# Patient Record
Sex: Female | Born: 1985 | Race: White | Hispanic: No | Marital: Married | Smoking: Never smoker
Health system: Southern US, Community
[De-identification: ages and names within clinical notes are randomized; demographics above are authoritative.]

## PROBLEM LIST (undated history)

## (undated) ENCOUNTER — Inpatient Hospital Stay (HOSPITAL_COMMUNITY): Payer: Self-pay

## (undated) DIAGNOSIS — J069 Acute upper respiratory infection, unspecified: Secondary | ICD-10-CM

## (undated) DIAGNOSIS — J45909 Unspecified asthma, uncomplicated: Secondary | ICD-10-CM

## (undated) HISTORY — PX: NO PAST SURGERIES: SHX2092

## (undated) HISTORY — PX: OTHER SURGICAL HISTORY: SHX169

---

## 2014-09-10 LAB — OB RESULTS CONSOLE RPR: RPR: NONREACTIVE

## 2014-09-10 LAB — OB RESULTS CONSOLE ABO/RH: RH Type: POSITIVE

## 2014-09-10 LAB — OB RESULTS CONSOLE RUBELLA ANTIBODY, IGM: Rubella: IMMUNE

## 2014-09-10 LAB — OB RESULTS CONSOLE HIV ANTIBODY (ROUTINE TESTING): HIV: NONREACTIVE

## 2014-09-10 LAB — OB RESULTS CONSOLE ANTIBODY SCREEN: Antibody Screen: NEGATIVE

## 2014-09-10 LAB — OB RESULTS CONSOLE HEPATITIS B SURFACE ANTIGEN: Hepatitis B Surface Ag: NEGATIVE

## 2014-09-10 LAB — OB RESULTS CONSOLE GC/CHLAMYDIA
CHLAMYDIA, DNA PROBE: NEGATIVE
Gonorrhea: NEGATIVE

## 2014-10-09 NOTE — L&D Delivery Note (Signed)
Delivery Note Pt pushed great for about 40 minutes and at 10:11 PM a healthy female was delivered via Vaginal, Spontaneous Delivery (Presentation: OA  ).  APGAR: 9, 9; weight 8 lb 3.9 oz (3740 g).   Placenta status: Intact, Spontaneous.  Cord: 3 vessels with the following complications: True Knot.    Anesthesia: Epidural  Episiotomy: None Lacerations: Partial 3rd degree;sulcal Suture Repair: 2.0 3.0 vicryl  In overlapping fashion on the rectal sphincter  Est. Blood Loss (mL): 660ml  Pt had mild atony following delivery which resolved with pitocin and bimanual massage.  She did have more than typical bleeding from her laceration while completing repair.  After repair, we sat her up to breastfeed and she felt  Faint.  BP dropped to 80's systolic, but recovered quickly with reclining pt and a fluid bolus.  Her bleeding was WNL and fundus firm.  Pulse was stable at 98 and BP recovered to 90's/60's which was not far from her baseline. She was able to sit after drinking some juice.  Mom to postpartum.  Baby to Couplet care / Skin to Skin.  Oliver PilaRICHARDSON,Nilton Lave W 03/06/2015, 11:29 PM

## 2015-01-31 LAB — OB RESULTS CONSOLE GBS: GBS: POSITIVE

## 2015-03-01 ENCOUNTER — Inpatient Hospital Stay (HOSPITAL_COMMUNITY)
Admission: AD | Admit: 2015-03-01 | Discharge: 2015-03-01 | Disposition: A | Discharge status: Home / Self Care | Payer: Managed Care, Other (non HMO) | Source: Ambulatory Visit | Attending: Obstetrics and Gynecology | Admitting: Obstetrics and Gynecology

## 2015-03-01 ENCOUNTER — Encounter (HOSPITAL_COMMUNITY): Payer: Self-pay

## 2015-03-01 DIAGNOSIS — O26893 Other specified pregnancy related conditions, third trimester: Secondary | ICD-10-CM | POA: Diagnosis not present

## 2015-03-01 DIAGNOSIS — Z3A39 39 weeks gestation of pregnancy: Secondary | ICD-10-CM | POA: Insufficient documentation

## 2015-03-01 HISTORY — DX: Acute upper respiratory infection, unspecified: J06.9

## 2015-03-01 LAB — POCT FERN TEST: POCT FERN TEST: NEGATIVE

## 2015-03-01 NOTE — MAU Provider Note (Signed)
S: Shawna Lopez is a 29 y.o. G1P0 at 7686w6d who presents today with leaking of fluid. She denies any VB. She confirms fetal movement. She has had some brown discharge. She was checked in the office on Wednesday, and was told she was closed. She denies any urinary sx. She did not have a large gush of fluid. She has not needed to wear a pad.  O: VSS, afebrile Abdomen: soft, non-tender, gravid External: no lesion Vagina: small amount of brown mucous. No pooling Cervix: pink, smooth, no fluid seen with valsalva. Closed/50/-2 Uterus: AGA FHT: 135, moderate with 15x15 accels, no decels Toco: no UCs  Results for orders placed or performed during the hospital encounter of 03/01/15 (from the past 24 hour(s))  Fern Test     Status: None   Collection Time: 03/01/15  8:38 PM  Result Value Ref Range   POCT Fern Test Negative = intact amniotic membranes    A/P: Exam for ROM RN will report to attending MD

## 2015-03-01 NOTE — Discharge Instructions (Signed)
Braxton Hicks Contractions °Contractions of the uterus can occur throughout pregnancy. Contractions are not always a sign that you are in labor.  °WHAT ARE BRAXTON HICKS CONTRACTIONS?  °Contractions that occur before labor are called Braxton Hicks contractions, or false labor. Toward the end of pregnancy (32-34 weeks), these contractions can develop more often and may become more forceful. This is not true labor because these contractions do not result in opening (dilatation) and thinning of the cervix. They are sometimes difficult to tell apart from true labor because these contractions can be forceful and people have different pain tolerances. You should not feel embarrassed if you go to the hospital with false labor. Sometimes, the only way to tell if you are in true labor is for your health care provider to look for changes in the cervix. °If there are no prenatal problems or other health problems associated with the pregnancy, it is completely safe to be sent home with false labor and await the onset of true labor. °HOW CAN YOU TELL THE DIFFERENCE BETWEEN TRUE AND FALSE LABOR? °False Labor °· The contractions of false labor are usually shorter and not as hard as those of true labor.   °· The contractions are usually irregular.   °· The contractions are often felt in the front of the lower abdomen and in the groin.   °· The contractions may go away when you walk around or change positions while lying down.   °· The contractions get weaker and are shorter lasting as time goes on.   °· The contractions do not usually become progressively stronger, regular, and closer together as with true labor.   °True Labor °· Contractions in true labor last 30-70 seconds, become very regular, usually become more intense, and increase in frequency.   °· The contractions do not go away with walking.   °· The discomfort is usually felt in the top of the uterus and spreads to the lower abdomen and low back.   °· True labor can be  determined by your health care provider with an exam. This will show that the cervix is dilating and getting thinner.   °WHAT TO REMEMBER °· Keep up with your usual exercises and follow other instructions given by your health care provider.   °· Take medicines as directed by your health care provider.   °· Keep your regular prenatal appointments.   °· Eat and drink lightly if you think you are going into labor.   °· If Braxton Hicks contractions are making you uncomfortable:   °¨ Change your position from lying down or resting to walking, or from walking to resting.   °¨ Sit and rest in a tub of warm water.   °¨ Drink 2-3 glasses of water. Dehydration may cause these contractions.   °¨ Do slow and deep breathing several times an hour.   °WHEN SHOULD I SEEK IMMEDIATE MEDICAL CARE? °Seek immediate medical care if: °· Your contractions become stronger, more regular, and closer together.   °· You have fluid leaking or gushing from your vagina.   °· You have a fever.   °· You pass blood-tinged mucus.   °· You have vaginal bleeding.   °· You have continuous abdominal pain.   °· You have low back pain that you never had before.   °· You feel your baby's head pushing down and causing pelvic pressure.   °· Your baby is not moving as much as it used to.   °Document Released: 09/25/2005 Document Revised: 09/30/2013 Document Reviewed: 07/07/2013 °[employer]® Patient Information ©2015 [employer]. This information is not intended to replace advice given to you by your health care   provider. Make sure you discuss any questions you have with your health care provider. ° °

## 2015-03-01 NOTE — MAU Note (Signed)
Leaking clear fluid this morning; since then it has turned into brown discharge. Denies contractions. Positive fetal movement.

## 2015-03-04 ENCOUNTER — Encounter (HOSPITAL_COMMUNITY): Payer: Self-pay | Admitting: *Deleted

## 2015-03-04 ENCOUNTER — Telehealth (HOSPITAL_COMMUNITY): Payer: Self-pay | Admitting: *Deleted

## 2015-03-04 NOTE — Telephone Encounter (Signed)
Preadmission screen  

## 2015-03-06 ENCOUNTER — Inpatient Hospital Stay (HOSPITAL_COMMUNITY): Payer: Managed Care, Other (non HMO) | Admitting: Anesthesiology

## 2015-03-06 ENCOUNTER — Encounter (HOSPITAL_COMMUNITY): Payer: Self-pay | Admitting: *Deleted

## 2015-03-06 ENCOUNTER — Inpatient Hospital Stay (HOSPITAL_COMMUNITY)
Admission: AD | Admit: 2015-03-06 | Discharge: 2015-03-08 | DRG: 988 | Disposition: A | Discharge status: Home / Self Care | Payer: Managed Care, Other (non HMO) | Source: Ambulatory Visit | Attending: Obstetrics and Gynecology | Admitting: Obstetrics and Gynecology

## 2015-03-06 DIAGNOSIS — Z3403 Encounter for supervision of normal first pregnancy, third trimester: Secondary | ICD-10-CM | POA: Diagnosis present

## 2015-03-06 DIAGNOSIS — O480 Post-term pregnancy: Secondary | ICD-10-CM | POA: Diagnosis present

## 2015-03-06 DIAGNOSIS — IMO0001 Reserved for inherently not codable concepts without codable children: Secondary | ICD-10-CM

## 2015-03-06 DIAGNOSIS — Z3A40 40 weeks gestation of pregnancy: Secondary | ICD-10-CM | POA: Diagnosis present

## 2015-03-06 DIAGNOSIS — O99824 Streptococcus B carrier state complicating childbirth: Secondary | ICD-10-CM | POA: Diagnosis present

## 2015-03-06 LAB — CBC
HCT: 38.3 % (ref 36.0–46.0)
Hemoglobin: 12.9 g/dL (ref 12.0–15.0)
MCH: 31.9 pg (ref 26.0–34.0)
MCHC: 33.7 g/dL (ref 30.0–36.0)
MCV: 94.6 fL (ref 78.0–100.0)
Platelets: 188 K/uL (ref 150–400)
RBC: 4.05 MIL/uL (ref 3.87–5.11)
RDW: 14.7 % (ref 11.5–15.5)
WBC: 11.6 K/uL — ABNORMAL HIGH (ref 4.0–10.5)

## 2015-03-06 LAB — TYPE AND SCREEN
ABO/RH(D): A POS
Antibody Screen: NEGATIVE

## 2015-03-06 LAB — ABO/RH: ABO/RH(D): A POS

## 2015-03-06 MED ORDER — FENTANYL 2.5 MCG/ML BUPIVACAINE 1/10 % EPIDURAL INFUSION (WH - ANES)
14.0000 mL/h | INTRAMUSCULAR | Status: DC | PRN
  Administered 2015-03-06 (×2): 14 mL/h via EPIDURAL
  Filled 2015-03-06: qty 125

## 2015-03-06 MED ORDER — LIDOCAINE HCL (PF) 1 % IJ SOLN
INTRAMUSCULAR | Status: DC | PRN
  Administered 2015-03-06 (×2): 4 mL

## 2015-03-06 MED ORDER — ONDANSETRON HCL 4 MG/2ML IJ SOLN: 4.0000 mg | Freq: Four times a day (QID) | INTRAMUSCULAR | Status: DC | PRN

## 2015-03-06 MED ORDER — LACTATED RINGERS IV SOLN
INTRAVENOUS | Status: DC
  Administered 2015-03-06 (×2): via INTRAVENOUS

## 2015-03-06 MED ORDER — FLEET ENEMA 7-19 GM/118ML RE ENEM: 1.0000 | ENEMA | Freq: Every day | RECTAL | Status: DC | PRN

## 2015-03-06 MED ORDER — ACETAMINOPHEN 325 MG PO TABS: 650.0000 mg | ORAL_TABLET | ORAL | Status: DC | PRN

## 2015-03-06 MED ORDER — LACTATED RINGERS IV SOLN
500.0000 mL | INTRAVENOUS | Status: DC | PRN
  Administered 2015-03-06: 250 mL via INTRAVENOUS

## 2015-03-06 MED ORDER — DIPHENHYDRAMINE HCL 50 MG/ML IJ SOLN: 12.5000 mg | INTRAMUSCULAR | Status: DC | PRN

## 2015-03-06 MED ORDER — OXYCODONE-ACETAMINOPHEN 5-325 MG PO TABS: 2.0000 | ORAL_TABLET | ORAL | Status: DC | PRN

## 2015-03-06 MED ORDER — CLINDAMYCIN PHOSPHATE 900 MG/50ML IV SOLN
900.0000 mg | Freq: Three times a day (TID) | INTRAVENOUS | Status: DC
  Administered 2015-03-06: 900 mg via INTRAVENOUS
  Filled 2015-03-06 (×4): qty 50

## 2015-03-06 MED ORDER — OXYTOCIN 40 UNITS IN LACTATED RINGERS INFUSION - SIMPLE MED
62.5000 mL/h | INTRAVENOUS | Status: DC
  Administered 2015-03-06: 62.5 mL/h via INTRAVENOUS

## 2015-03-06 MED ORDER — LIDOCAINE HCL (PF) 1 % IJ SOLN
30.0000 mL | INTRAMUSCULAR | Status: DC | PRN
  Filled 2015-03-06: qty 30

## 2015-03-06 MED ORDER — OXYCODONE-ACETAMINOPHEN 5-325 MG PO TABS: 1.0000 | ORAL_TABLET | ORAL | Status: DC | PRN

## 2015-03-06 MED ORDER — PHENYLEPHRINE 40 MCG/ML (10ML) SYRINGE FOR IV PUSH (FOR BLOOD PRESSURE SUPPORT)
80.0000 ug | PREFILLED_SYRINGE | INTRAVENOUS | Status: DC | PRN
  Filled 2015-03-06: qty 20
  Filled 2015-03-06: qty 2

## 2015-03-06 MED ORDER — EPHEDRINE 5 MG/ML INJ
10.0000 mg | INTRAVENOUS | Status: DC | PRN
  Filled 2015-03-06: qty 2

## 2015-03-06 MED ORDER — OXYTOCIN 40 UNITS IN LACTATED RINGERS INFUSION - SIMPLE MED
1.0000 m[IU]/min | INTRAVENOUS | Status: DC
  Administered 2015-03-06: 2 m[IU]/min via INTRAVENOUS
  Filled 2015-03-06: qty 1000

## 2015-03-06 MED ORDER — TERBUTALINE SULFATE 1 MG/ML IJ SOLN: 0.2500 mg | Freq: Once | INTRAMUSCULAR | Status: AC | PRN

## 2015-03-06 MED ORDER — BUTORPHANOL TARTRATE 1 MG/ML IJ SOLN: 1.0000 mg | INTRAMUSCULAR | Status: DC | PRN

## 2015-03-06 MED ORDER — CITRIC ACID-SODIUM CITRATE 334-500 MG/5ML PO SOLN: 30.0000 mL | ORAL | Status: DC | PRN

## 2015-03-06 MED ORDER — OXYTOCIN BOLUS FROM INFUSION
500.0000 mL | INTRAVENOUS | Status: DC
  Administered 2015-03-06 (×2): 500 mL via INTRAVENOUS

## 2015-03-06 NOTE — Anesthesia Preprocedure Evaluation (Signed)
Anesthesia Evaluation  Patient identified by MRN, date of birth, ID band Patient awake    Reviewed: Allergy & Precautions, NPO status , Patient's Chart, lab work & pertinent test results  History of Anesthesia Complications Negative for: history of anesthetic complications  Airway Mallampati: II  TM Distance: >3 FB Neck ROM: Full    Dental no notable dental hx. (+) Dental Advisory Given   Pulmonary neg pulmonary ROS,  breath sounds clear to auscultation  Pulmonary exam normal       Cardiovascular negative cardio ROS Normal cardiovascular examRhythm:Regular Rate:Normal     Neuro/Psych negative neurological ROS  negative psych ROS   GI/Hepatic negative GI ROS, Neg liver ROS,   Endo/Other  negative endocrine ROS  Renal/GU negative Renal ROS  negative genitourinary   Musculoskeletal negative musculoskeletal ROS (+)   Abdominal   Peds negative pediatric ROS (+)  Hematology negative hematology ROS (+)   Anesthesia Other Findings   Reproductive/Obstetrics (+) Pregnancy                             Anesthesia Physical Anesthesia Plan  ASA: II  Anesthesia Plan: Epidural   Post-op Pain Management:    Induction:   Airway Management Planned:   Additional Equipment:   Intra-op Plan:   Post-operative Plan:   Informed Consent: I have reviewed the patients History and Physical, chart, labs and discussed the procedure including the risks, benefits and alternatives for the proposed anesthesia with the patient or authorized representative who has indicated his/her understanding and acceptance.   Dental advisory given  Plan Discussed with:   Anesthesia Plan Comments:         Anesthesia Quick Evaluation  

## 2015-03-06 NOTE — Progress Notes (Signed)
Patient ID: Shawna GladeArzu Lopez, female   DOB: [birth year], 29 y.o.   MRN: [id number] Pt still comfortable with epidural, started making more rapid progress over the last hour FHR overall reassuring  Baseline 120 Cervix 6-7/0, then 15 min later anterior lip/+1  Baseline back to normal after short decel to 100 with rapid descent. Plan to start pushing soon.

## 2015-03-06 NOTE — H&P (Signed)
Shawna Lopez is a 29 y.o. female G1P0 at 5140 4/7 weeks (EDD 03/02/15 by 15 week US inconsistent with LMP)  presenting for regular contractions and cervical change to 3+cm.  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  Conceived on her own and was having 21 day cycles.  She had an abnormal one hour GTT then a normal 3 hour GTT.  She is GBS positive.  Maternal Medical History:  Reason for admission: Contractions.   Contractions: Onset was 6-12 hours ago.   Frequency: regular.   Perceived severity is moderate.    Fetal activity: Perceived fetal activity is normal.    Prenatal Complications - Diabetes: none.    OB History    Gravida Para Term Preterm AB TAB SAB Ectopic Multiple Living   1              Past Medical History  Diagnosis Date  . Recurrent upper respiratory infection (URI)     2014   Past Surgical History  Procedure Laterality Date  . Uterine polyp     Family History: family history includes Cystic fibrosis in her mother; Diabetes in her mother. Social History:  reports that she has never smoked. She does not have any smokeless tobacco history on file. She reports that she does not drink alcohol or use illicit drugs.   Prenatal Transfer Tool  Maternal Diabetes: No Genetic Screening: Declined Maternal Ultrasounds/Referrals: Normal Fetal Ultrasounds or other Referrals:  None Maternal Substance Abuse:  No Significant Maternal Medications:  None Significant Maternal Lab Results:  Lab values include: Group B Strep positive Other Comments:  None  ROS  Dilation: 3 Effacement (%): 90 Station: 0 Exam by:: Dr. Senaida Oresichardson  AROM clear  Blood pressure 121/69, pulse 87, temperature 98 F (36.7 C), temperature source Oral, resp. rate 18, height 5' (1.524 m), weight 76.204 kg (168 lb), SpO2 100 %. Maternal Exam:  Uterine Assessment: Contraction strength is moderate.  Contraction frequency is regular.   Abdomen: Fetal presentation:  vertex  Introitus: Normal vulva. Normal vagina.    Physical Exam  Constitutional: She appears well-developed and well-nourished.  Cardiovascular: Normal rate.   Respiratory: Effort normal.  GI: Soft.  Genitourinary: Vagina normal and uterus normal.  Neurological: She is alert.  Psychiatric: She has a normal mood and affect.    Prenatal labs: ABO, Rh: --/--/A POS (05/28 1515) Antibody: NEG (05/28 1515) Rubella: Immune (12/03 0000) RPR: Nonreactive (12/03 0000)  HBsAg: Negative (12/03 0000)  HIV: Non-reactive (12/03 0000)  GBS: Positive (04/24 0000)  One hour GTT 188 3 hour GTT 85/115/95/90   Assessment/Plan: Pt received epidural and comfortable.  On clindamycin for +GBS.  IUPC placed and will monitor contraction strength and progress. FHR category one.   Shawna Lopez,Shawna Lopez W 03/06/2015, 6:41 PM

## 2015-03-06 NOTE — Anesthesia Procedure Notes (Signed)
Epidural Patient location during procedure: OB  Staffing Anesthesiologist: Uriyah Massimo Performed by: anesthesiologist   Preanesthetic Checklist Completed: patient identified, site marked, surgical consent, pre-op evaluation, timeout performed, IV checked, risks and benefits discussed and monitors and equipment checked  Epidural Patient position: sitting Prep: site prepped and draped and DuraPrep Patient monitoring: continuous pulse ox and blood pressure Approach: midline Location: L3-L4 Injection technique: LOR saline  Needle:  Needle type: Tuohy  Needle gauge: 17 G Needle length: 9 cm and 9 Needle insertion depth: 5 cm cm Catheter type: closed end flexible Catheter size: 19 Gauge Catheter at skin depth: 10 cm Test dose: negative  Assessment Events: blood not aspirated, injection not painful, no injection resistance, negative IV test and no paresthesia

## 2015-03-06 NOTE — MAU Note (Signed)
Pt. States she has been contracting for a few days. Today has been having closer contractions that are now every 5 mins and have been like this for the past 2 hrs. Denies LOF. PT. States she is passing mucous. Last appointment was Thursday and was 1 cm. Pt. Is scheduled to be induced on Tuesday. Pt. States baby is moving well.

## 2015-03-06 NOTE — MAU Note (Signed)
Erin Consulting civil engineercharge RN in Control and instrumentation engineerbirthing suites given report on admission. Ok for pt. To be transported to room 166.

## 2015-03-06 NOTE — MAU Note (Signed)
Pt. And SO agreeable to POC. Pt. Will walk and be reevaluated after 2 hours.

## 2015-03-06 NOTE — MAU Note (Signed)
Dr. Senaida Oresichardson called. To give pt. Option to walk for 2 hours and then be reevaluated. To decide on POC after that if pt. chooses to say.

## 2015-03-07 LAB — CBC
HCT: 28.6 % — ABNORMAL LOW (ref 36.0–46.0)
HEMOGLOBIN: 9.7 g/dL — AB (ref 12.0–15.0)
MCH: 31.8 pg (ref 26.0–34.0)
MCHC: 33.9 g/dL (ref 30.0–36.0)
MCV: 93.8 fL (ref 78.0–100.0)
Platelets: 158 10*3/uL (ref 150–400)
RBC: 3.05 MIL/uL — AB (ref 3.87–5.11)
RDW: 14.5 % (ref 11.5–15.5)
WBC: 15.1 10*3/uL — AB (ref 4.0–10.5)

## 2015-03-07 LAB — RPR: RPR Ser Ql: NONREACTIVE

## 2015-03-07 MED ORDER — ONDANSETRON HCL 4 MG/2ML IJ SOLN: 4.0000 mg | INTRAMUSCULAR | Status: DC | PRN

## 2015-03-07 MED ORDER — SIMETHICONE 80 MG PO CHEW: 80.0000 mg | CHEWABLE_TABLET | ORAL | Status: DC | PRN

## 2015-03-07 MED ORDER — ACETAMINOPHEN 325 MG PO TABS
650.0000 mg | ORAL_TABLET | ORAL | Status: DC | PRN
  Administered 2015-03-07 (×3): 650 mg via ORAL
  Filled 2015-03-07 (×4): qty 2

## 2015-03-07 MED ORDER — PRENATAL MULTIVITAMIN CH
1.0000 | ORAL_TABLET | Freq: Every day | ORAL | Status: DC
  Administered 2015-03-07: 1 via ORAL
  Filled 2015-03-07: qty 1

## 2015-03-07 MED ORDER — OXYCODONE-ACETAMINOPHEN 5-325 MG PO TABS: 2.0000 | ORAL_TABLET | ORAL | Status: DC | PRN

## 2015-03-07 MED ORDER — SENNOSIDES-DOCUSATE SODIUM 8.6-50 MG PO TABS
2.0000 | ORAL_TABLET | ORAL | Status: DC
  Filled 2015-03-07: qty 2

## 2015-03-07 MED ORDER — TETANUS-DIPHTH-ACELL PERTUSSIS 5-2.5-18.5 LF-MCG/0.5 IM SUSP: 0.5000 mL | Freq: Once | INTRAMUSCULAR | Status: DC

## 2015-03-07 MED ORDER — DIBUCAINE 1 % RE OINT: 1.0000 "application " | TOPICAL_OINTMENT | RECTAL | Status: DC | PRN

## 2015-03-07 MED ORDER — DIPHENHYDRAMINE HCL 25 MG PO CAPS: 25.0000 mg | ORAL_CAPSULE | Freq: Four times a day (QID) | ORAL | Status: DC | PRN

## 2015-03-07 MED ORDER — WITCH HAZEL-GLYCERIN EX PADS: 1.0000 "application " | MEDICATED_PAD | CUTANEOUS | Status: DC | PRN

## 2015-03-07 MED ORDER — LANOLIN HYDROUS EX OINT: TOPICAL_OINTMENT | CUTANEOUS | Status: DC | PRN

## 2015-03-07 MED ORDER — OXYCODONE-ACETAMINOPHEN 5-325 MG PO TABS: 1.0000 | ORAL_TABLET | ORAL | Status: DC | PRN

## 2015-03-07 MED ORDER — BENZOCAINE-MENTHOL 20-0.5 % EX AERO
1.0000 "application " | INHALATION_SPRAY | CUTANEOUS | Status: DC | PRN
  Filled 2015-03-07: qty 56

## 2015-03-07 MED ORDER — ONDANSETRON HCL 4 MG PO TABS: 4.0000 mg | ORAL_TABLET | ORAL | Status: DC | PRN

## 2015-03-07 MED ORDER — ZOLPIDEM TARTRATE 5 MG PO TABS: 5.0000 mg | ORAL_TABLET | Freq: Every evening | ORAL | Status: DC | PRN

## 2015-03-07 NOTE — Progress Notes (Signed)
Attempted to get pt up to bathroom with use of steady, pt became light headed and pale. Assisted back into bed, with pt improving in color and no longer light headed. Was able to void on bed pan, peri care done.

## 2015-03-07 NOTE — Lactation Note (Signed)
This note was copied from the chart of Shawna Lopez. Lactation Consultation Note  Patient Name: Shawna Wilder Gladerzu Wogan [id number]'s Date: 03/07/2015 Reason for consult: Initial assessment  Visited with Mom, baby 3415 hrs old.  Mom sitting in chair, baby sleeping in crib.  Mom has had 4 breast feedings, with latches of 8 and 9.  Mom describes a good latch.  Encouraged continued skin to skin, and feeding often on cue.  Offered assistance as needed, telling Mom of IP and OP lactation services available.  Brochure left with Mom.   To call prn, and follow up in am.    Consult Status Consult Status: Follow-up Date: 03/08/15 Follow-up type: In-patient    Judee ClaraSmith, Jakwon Gayton E 03/07/2015, 1:43 PM

## 2015-03-07 NOTE — Progress Notes (Signed)
Post Partum Day 1 Subjective: Pt still feeling weak and dizzy.  Went to bathroom earlier and got faint while on toilet.  Uncomfortable in stitches and has taken no pain medication since delivery. Does not feel dizzy at rest  Objective: Blood pressure 97/55, pulse 93, temperature 98.5 F (36.9 C), temperature source Oral, resp. rate 18, height 5' (1.524 m), weight 76.204 kg (168 lb), SpO2 98 %, unknown if currently breastfeeding.  Physical Exam:  General: alert and cooperative Lochia: appropriate Uterine Fundus: firm Perineum inspected and seems intact, no evidence of hematoma  Recent Labs  03/06/15 1515 03/07/15 0616  HGB 12.9 9.7*  HCT 38.3 28.6*    Assessment/Plan: D/w pt that she is still getting used to her drop in hemoglobin.  No obvious sign of a hematoma on exam. She would definitely feel better if she would take pain meds (tylenol) and rested.  Advised her to at least start with some tylenol and then add percocet if needed.  Continue stool softeners.  Will recheck CBC this pm if she does not continue to improve her ambulation after rest and tylenol.   LOS: 1 day   Serrita Lueth W 03/07/2015, 10:10 AM

## 2015-03-07 NOTE — Anesthesia Postprocedure Evaluation (Signed)
Anesthesia Post Note  Patient: Shawna Lopez  Procedure(s) Performed: * No procedures listed *  Anesthesia type: Epidural  Patient location: Mother/Baby  Post pain: Pain level controlled  Post assessment: Post-op Vital signs reviewed  Last Vitals:  Filed Vitals:   03/07/15 0545  BP: 97/55  Pulse:   Temp:   Resp:     Post vital signs: Reviewed  Level of consciousness:alert  Complications: No apparent anesthesia complications

## 2015-03-08 NOTE — Discharge Summary (Signed)
Obstetric Discharge Summary Reason for Admission: onset of labor Prenatal Procedures: none Intrapartum Procedures: spontaneous vaginal delivery Postpartum Procedures: none Complications-Operative and Postpartum: 3rd degree perineal laceration and hemorrhage HEMOGLOBIN  Date Value Ref Range Status  03/07/2015 9.7* 12.0 - 15.0 g/dL Final    Comment:    DELTA CHECK NOTED REPEATED TO VERIFY    HCT  Date Value Ref Range Status  03/07/2015 28.6* 36.0 - 46.0 % Final    Physical Exam:  General: alert Lochia: appropriate Uterine Fundus: firm  Discharge Diagnoses: Term Pregnancy-delivered  Discharge Information: Date: 03/08/2015 Activity: pelvic rest Diet: routine Medications: Tylenol Condition: stable Instructions: refer to practice specific booklet Discharge to: home Follow-up Information    Follow up with Oliver PilaICHARDSON,KATHY W, MD. Schedule an appointment as soon as possible for a visit in 6 weeks.   Specialty:  Obstetrics and Gynecology   Contact information:   [address] [place] [id number] [phone]       Newborn Data: Live born female  Birth Weight: 8 lb 3.9 oz (3740 g) APGAR: 9, 9  Home with mother.  Leonidus Rowand D 03/08/2015, 9:39 AM

## 2015-03-08 NOTE — Progress Notes (Signed)
Patient ID: Shawna GladeArzu Lopez, female   DOB: [birth year], 29 y.o.   MRN: [id number] PPD #2 Feeling better since yesterday afternoon Afeb, VSS D/c home

## 2015-03-08 NOTE — Discharge Instructions (Signed)
As per discharge pamphlet °

## 2015-03-10 ENCOUNTER — Inpatient Hospital Stay (HOSPITAL_COMMUNITY): Admission: RE | Admit: 2015-03-10 | Payer: 59 | Source: Ambulatory Visit

## 2017-01-19 ENCOUNTER — Ambulatory Visit
Admission: RE | Admit: 2017-01-19 | Discharge: 2017-01-19 | Disposition: A | Discharge status: Home / Self Care | Payer: Managed Care, Other (non HMO) | Source: Ambulatory Visit | Attending: Family Medicine | Admitting: Family Medicine

## 2017-01-19 ENCOUNTER — Other Ambulatory Visit: Payer: Self-pay | Admitting: Family Medicine

## 2017-01-19 DIAGNOSIS — R0789 Other chest pain: Secondary | ICD-10-CM

## 2017-08-07 ENCOUNTER — Other Ambulatory Visit (HOSPITAL_COMMUNITY)
Admission: RE | Admit: 2017-08-07 | Discharge: 2017-08-07 | Disposition: A | Discharge status: Home / Self Care | Payer: Managed Care, Other (non HMO) | Source: Ambulatory Visit | Attending: Family Medicine | Admitting: Family Medicine

## 2017-08-07 ENCOUNTER — Other Ambulatory Visit: Payer: Self-pay | Admitting: Family Medicine

## 2017-08-07 DIAGNOSIS — Z0000 Encounter for general adult medical examination without abnormal findings: Secondary | ICD-10-CM | POA: Insufficient documentation

## 2017-08-09 LAB — CYTOLOGY - PAP
Diagnosis: NEGATIVE
HPV: NOT DETECTED

## 2017-09-12 LAB — OB RESULTS CONSOLE HIV ANTIBODY (ROUTINE TESTING): HIV: NONREACTIVE

## 2017-09-12 LAB — OB RESULTS CONSOLE RPR: RPR: NONREACTIVE

## 2017-09-12 LAB — OB RESULTS CONSOLE RUBELLA ANTIBODY, IGM: Rubella: IMMUNE

## 2017-09-12 LAB — OB RESULTS CONSOLE HEPATITIS B SURFACE ANTIGEN: Hepatitis B Surface Ag: NEGATIVE

## 2017-09-27 LAB — OB RESULTS CONSOLE GC/CHLAMYDIA
Chlamydia: NEGATIVE
Gonorrhea: NEGATIVE

## 2017-10-09 NOTE — L&D Delivery Note (Signed)
Delivery Note At 3:21 AM a viable and healthy female was delivered via Vaginal, Spontaneous (Presentation:vtx ROA ;  ).  APGAR: 9, 9; weight pending  .   Placenta status:spontaneous intact not sent , .  Cord:none  with the following complications:none .  Cord pH: n/a  Anesthesia:  epidural Episiotomy: None Lacerations: 2nd degree perineal Suture Repair: 3.0 chromic Est. Blood Loss (mL): 350  Mom to postpartum.  Baby to Couplet care / Skin to Skin.  Shawna Lopez A Shawna Lopez 04/06/2018, 4:12 AM

## 2018-02-19 ENCOUNTER — Encounter (HOSPITAL_COMMUNITY): Payer: Self-pay

## 2018-02-19 ENCOUNTER — Other Ambulatory Visit (HOSPITAL_COMMUNITY): Payer: Self-pay | Admitting: Obstetrics and Gynecology

## 2018-02-19 DIAGNOSIS — O289 Unspecified abnormal findings on antenatal screening of mother: Secondary | ICD-10-CM

## 2018-02-25 ENCOUNTER — Encounter (HOSPITAL_COMMUNITY): Payer: Self-pay | Admitting: *Deleted

## 2018-02-26 ENCOUNTER — Ambulatory Visit (HOSPITAL_COMMUNITY)
Admission: RE | Admit: 2018-02-26 | Discharge: 2018-02-26 | Disposition: A | Discharge status: Home / Self Care | Payer: Managed Care, Other (non HMO) | Source: Ambulatory Visit | Attending: Obstetrics and Gynecology | Admitting: Obstetrics and Gynecology

## 2018-02-26 ENCOUNTER — Other Ambulatory Visit (HOSPITAL_COMMUNITY): Payer: Self-pay | Admitting: Obstetrics and Gynecology

## 2018-02-26 ENCOUNTER — Encounter (HOSPITAL_COMMUNITY): Payer: Self-pay

## 2018-02-26 DIAGNOSIS — O289 Unspecified abnormal findings on antenatal screening of mother: Secondary | ICD-10-CM

## 2018-02-26 DIAGNOSIS — O3506X0 Maternal care for (suspected) central nervous system malformation or damage in fetus, hydrocephaly, not applicable or unspecified: Secondary | ICD-10-CM

## 2018-02-26 DIAGNOSIS — IMO0002 Reserved for concepts with insufficient information to code with codable children: Secondary | ICD-10-CM

## 2018-02-26 DIAGNOSIS — Z3A34 34 weeks gestation of pregnancy: Secondary | ICD-10-CM

## 2018-02-26 DIAGNOSIS — Z363 Encounter for antenatal screening for malformations: Secondary | ICD-10-CM

## 2018-02-26 DIAGNOSIS — O350XX0 Maternal care for (suspected) central nervous system malformation in fetus, not applicable or unspecified: Secondary | ICD-10-CM

## 2018-02-26 HISTORY — DX: Unspecified asthma, uncomplicated: J45.909

## 2018-02-27 ENCOUNTER — Other Ambulatory Visit (HOSPITAL_COMMUNITY): Payer: Self-pay | Admitting: *Deleted

## 2018-02-27 DIAGNOSIS — O359XX0 Maternal care for (suspected) fetal abnormality and damage, unspecified, not applicable or unspecified: Secondary | ICD-10-CM

## 2018-03-05 LAB — OB RESULTS CONSOLE GBS: GBS: NEGATIVE

## 2018-03-19 ENCOUNTER — Encounter (HOSPITAL_COMMUNITY): Payer: Self-pay

## 2018-03-19 ENCOUNTER — Ambulatory Visit (HOSPITAL_COMMUNITY)
Admission: RE | Admit: 2018-03-19 | Discharge: 2018-03-19 | Disposition: A | Discharge status: Home / Self Care | Payer: Managed Care, Other (non HMO) | Source: Ambulatory Visit | Attending: Obstetrics and Gynecology | Admitting: Obstetrics and Gynecology

## 2018-03-19 ENCOUNTER — Other Ambulatory Visit (HOSPITAL_COMMUNITY): Payer: Self-pay | Admitting: Maternal & Fetal Medicine

## 2018-03-19 DIAGNOSIS — O359XX0 Maternal care for (suspected) fetal abnormality and damage, unspecified, not applicable or unspecified: Secondary | ICD-10-CM

## 2018-03-19 DIAGNOSIS — Z3A37 37 weeks gestation of pregnancy: Secondary | ICD-10-CM

## 2018-03-19 DIAGNOSIS — O289 Unspecified abnormal findings on antenatal screening of mother: Secondary | ICD-10-CM | POA: Insufficient documentation

## 2018-03-19 DIAGNOSIS — Z362 Encounter for other antenatal screening follow-up: Secondary | ICD-10-CM | POA: Insufficient documentation

## 2018-04-05 ENCOUNTER — Inpatient Hospital Stay (HOSPITAL_COMMUNITY)
Admission: AD | Admit: 2018-04-05 | Discharge: 2018-04-05 | Disposition: A | Discharge status: Home / Self Care | Payer: Managed Care, Other (non HMO) | Source: Ambulatory Visit | Attending: Obstetrics and Gynecology | Admitting: Obstetrics and Gynecology

## 2018-04-05 ENCOUNTER — Inpatient Hospital Stay (HOSPITAL_COMMUNITY)
Admission: AD | Admit: 2018-04-05 | Discharge: 2018-04-08 | DRG: 806 | Disposition: A | Discharge status: Home / Self Care | Payer: Managed Care, Other (non HMO) | Attending: Obstetrics and Gynecology | Admitting: Obstetrics and Gynecology

## 2018-04-05 ENCOUNTER — Encounter (HOSPITAL_COMMUNITY): Payer: Self-pay | Admitting: *Deleted

## 2018-04-05 DIAGNOSIS — O471 False labor at or after 37 completed weeks of gestation: Secondary | ICD-10-CM

## 2018-04-05 DIAGNOSIS — Z880 Allergy status to penicillin: Secondary | ICD-10-CM

## 2018-04-05 DIAGNOSIS — J45909 Unspecified asthma, uncomplicated: Secondary | ICD-10-CM | POA: Diagnosis present

## 2018-04-05 DIAGNOSIS — O9952 Diseases of the respiratory system complicating childbirth: Principal | ICD-10-CM | POA: Diagnosis present

## 2018-04-05 DIAGNOSIS — O9081 Anemia of the puerperium: Secondary | ICD-10-CM | POA: Diagnosis not present

## 2018-04-05 DIAGNOSIS — Z3A40 40 weeks gestation of pregnancy: Secondary | ICD-10-CM

## 2018-04-05 DIAGNOSIS — D62 Acute posthemorrhagic anemia: Secondary | ICD-10-CM

## 2018-04-05 NOTE — Discharge Instructions (Signed)

## 2018-04-05 NOTE — MAU Note (Signed)
I have communicated with Dr. Cherly Hensenousins and reviewed vital signs:  Vitals:   04/05/18 0654 04/05/18 0727  BP: 105/69 108/71  Pulse: 74 74  Resp: 16 18  Temp: 98.5 F (36.9 C) (!) 97.5 F (36.4 C)  SpO2: 100% 100%    Vaginal exam:  Dilation: 3 Effacement (%): (75) Cervical Position: Posterior Station: -2 Presentation: Vertex Exam by:: F. Kert Shackett, RNC,   Also reviewed contraction pattern and that non-stress test is reactive.  It has been documented that patient is contracting every 2-8 minutes not indicating active labor.  Patient denies any other complaints.  Based on this report provider has given order for discharge.  A discharge order and diagnosis entered by a provider.   Labor discharge instructions reviewed with patient.

## 2018-04-05 NOTE — MAU Note (Signed)
Pt reports contractions every 8 mins. Denies LOF but reports brown discharge. Reports good fetal movement. States cervix was last week and only open a little bit

## 2018-04-05 NOTE — H&P (Signed)
Shawna Lopez is Shawna 32 y.o. female presenting @ term in active labor. Intact membrane. (+) FM GBS cx neg OB History    Gravida  2   Para  1   Term  1   Preterm      AB      Living  1     SAB      TAB      Ectopic      Multiple  0   Live Births  1          Past Medical History:  Diagnosis Date  . Asthma   . Recurrent upper respiratory infection (URI)    2014   Past Surgical History:  Procedure Laterality Date  . NO PAST SURGERIES    . uterine polyp     Family History: family history includes Cystic fibrosis in her mother; Diabetes in her mother. Social History:  reports that she has never smoked. She has never used smokeless tobacco. She reports that she does not drink alcohol or use drugs.     Maternal Diabetes: No Genetic Screening: Declined Maternal Ultrasounds/Referrals: Abnormal:  Findings:   Other:possible fetal cerebral ventriculomegaly Fetal Ultrasounds or other Referrals:  Referred to Materal Fetal Medicine  Maternal Substance Abuse:  No Significant Maternal Medications:  None Significant Maternal Lab Results:  Lab values include: Group B Strep negative Other Comments:  None  Review of Systems  All other systems reviewed and are negative.  History Dilation: 5 Effacement (%): 80 Station: -2 Exam by::  T Lytle RN Blood pressure 118/63, pulse 93, temperature 97.6 F (36.4 C), resp. rate 18, last menstrual period 07/08/2017, unknown if currently breastfeeding. Exam Physical Exam  Constitutional: She is oriented to person, place, and time. She appears well-developed and well-nourished.  HENT:  Head: Atraumatic.  Eyes: EOM are normal.  Neck: Neck supple.  Cardiovascular: Regular rhythm.  Respiratory: Effort normal.  GI: Soft.  Musculoskeletal: She exhibits no edema.  Neurological: She is alert and oriented to person, place, and time.  Skin: Skin is warm and dry.  Psychiatric: She has Shawna normal mood and affect.    Prenatal labs: ABO, Rh:   Shawna positive Antibody:  neg Rubella:  Immune RPR:   NR HBsAg:  neg  HIV:   neg GBS:   neg  Assessment/Plan: Active labor Term gestation Possible fetal cerebral venriculomegaly P) admit routine labs. Epidural. Amniotomy prn   Shawna Lopez Shawna Lopez 04/05/2018, 11:50 PM

## 2018-04-05 NOTE — MAU Provider Note (Signed)
History     Chief Complaint  Patient presents with  . Labor Eval  32 yo G2P1 MWF at term presents with c/o ctx q 8-1210mins. Pt denies rupture of membrane. Does not want to Miss epidural   OB History    Gravida  2   Para  1   Term  1   Preterm      AB      Living  1     SAB      TAB      Ectopic      Multiple  0   Live Births  1           Past Medical History:  Diagnosis Date  . Asthma   . Recurrent upper respiratory infection (URI)    2014    Past Surgical History:  Procedure Laterality Date  . NO PAST SURGERIES    . uterine polyp      Family History  Problem Relation Age of Onset  . Diabetes Mother   . Cystic fibrosis Mother     Social History   Tobacco Use  . Smoking status: Never Smoker  . Smokeless tobacco: Never Used  Substance Use Topics  . Alcohol use: No  . Drug use: No    Allergies:  Allergies  Allergen Reactions  . Ampicillin Nausea And Vomiting  . Penicillins Nausea And Vomiting    Fainting as well    Medications Prior to Admission  Medication Sig Dispense Refill Last Dose  . ALBUTEROL IN Inhale into the lungs.   Taking  . prenatal vitamin w/FE, FA (PRENATAL 1 + 1) 27-1 MG TABS tablet Take 1 tablet by mouth daily at 12 noon.   Taking     Physical Exam   Blood pressure 105/69, pulse 74, temperature 98.5 F (36.9 C), temperature source Oral, resp. rate 16, height 5\' 3"  (1.6 m), weight 67.1 kg (148 lb), last menstrual period 07/08/2017, SpO2 100 %, unknown if currently breastfeeding.  General appearance: alert, cooperative and no distress Heart: regular rate and rhythm, S1, S2 normal, no murmur, click, rub or gallop Abdomen: gravid Extremities: no edema, redness or tenderness in the calves or thighs  VE per RN 3/75/-2 posterior  Tracing reactive. Baseline 125 Ctx q 8-10 mins ED Course  IMP: false labor Term gestation P) d/c home reviewed labor prec. cxl ob appt today. F/u next week if  undelivered MDM   Shawna KyleSheronette A Aarilyn Dye, MD 7:28 AM 04/05/2018

## 2018-04-05 NOTE — MAU Note (Signed)
Was seen MAU early for labor ck. Was 3cm then. Ctxs stronger. Some brown d/c but denies vag bleeding or LOF

## 2018-04-06 ENCOUNTER — Inpatient Hospital Stay (HOSPITAL_COMMUNITY): Payer: Managed Care, Other (non HMO) | Admitting: Anesthesiology

## 2018-04-06 ENCOUNTER — Encounter (HOSPITAL_COMMUNITY): Payer: Self-pay

## 2018-04-06 DIAGNOSIS — J45909 Unspecified asthma, uncomplicated: Secondary | ICD-10-CM | POA: Diagnosis present

## 2018-04-06 DIAGNOSIS — Z880 Allergy status to penicillin: Secondary | ICD-10-CM | POA: Diagnosis not present

## 2018-04-06 DIAGNOSIS — D62 Acute posthemorrhagic anemia: Secondary | ICD-10-CM | POA: Diagnosis not present

## 2018-04-06 DIAGNOSIS — Z3483 Encounter for supervision of other normal pregnancy, third trimester: Secondary | ICD-10-CM | POA: Diagnosis present

## 2018-04-06 DIAGNOSIS — Z3A40 40 weeks gestation of pregnancy: Secondary | ICD-10-CM | POA: Diagnosis not present

## 2018-04-06 DIAGNOSIS — O9081 Anemia of the puerperium: Secondary | ICD-10-CM | POA: Diagnosis not present

## 2018-04-06 DIAGNOSIS — O9952 Diseases of the respiratory system complicating childbirth: Secondary | ICD-10-CM | POA: Diagnosis present

## 2018-04-06 LAB — CBC
HEMATOCRIT: 32.9 % — AB (ref 36.0–46.0)
Hemoglobin: 11 g/dL — ABNORMAL LOW (ref 12.0–15.0)
MCH: 29.1 pg (ref 26.0–34.0)
MCHC: 33.4 g/dL (ref 30.0–36.0)
MCV: 87 fL (ref 78.0–100.0)
Platelets: 261 10*3/uL (ref 150–400)
RBC: 3.78 MIL/uL — ABNORMAL LOW (ref 3.87–5.11)
RDW: 14.8 % (ref 11.5–15.5)
WBC: 9.8 10*3/uL (ref 4.0–10.5)

## 2018-04-06 LAB — TYPE AND SCREEN
ABO/RH(D): A POS
Antibody Screen: NEGATIVE

## 2018-04-06 LAB — RPR: RPR Ser Ql: NONREACTIVE

## 2018-04-06 MED ORDER — EPHEDRINE 5 MG/ML INJ
10.0000 mg | INTRAVENOUS | Status: DC | PRN
  Filled 2018-04-06: qty 2

## 2018-04-06 MED ORDER — LIDOCAINE HCL (PF) 1 % IJ SOLN
30.0000 mL | INTRAMUSCULAR | Status: DC | PRN
  Filled 2018-04-06: qty 30

## 2018-04-06 MED ORDER — LACTATED RINGERS IV SOLN
INTRAVENOUS | Status: DC
  Administered 2018-04-06: via INTRAVENOUS

## 2018-04-06 MED ORDER — FENTANYL 2.5 MCG/ML BUPIVACAINE 1/10 % EPIDURAL INFUSION (WH - ANES)
14.0000 mL/h | INTRAMUSCULAR | Status: DC | PRN
  Administered 2018-04-06: 14 mL/h via EPIDURAL

## 2018-04-06 MED ORDER — OXYTOCIN 40 UNITS IN LACTATED RINGERS INFUSION - SIMPLE MED
2.5000 [IU]/h | INTRAVENOUS | Status: DC
  Administered 2018-04-06: 2.5 [IU]/h via INTRAVENOUS
  Filled 2018-04-06: qty 1000

## 2018-04-06 MED ORDER — FENTANYL 2.5 MCG/ML BUPIVACAINE 1/10 % EPIDURAL INFUSION (WH - ANES)
INTRAMUSCULAR | Status: AC
  Filled 2018-04-06: qty 100

## 2018-04-06 MED ORDER — LACTATED RINGERS IV SOLN: 500.0000 mL | INTRAVENOUS | Status: DC | PRN

## 2018-04-06 MED ORDER — OXYTOCIN BOLUS FROM INFUSION
500.0000 mL | Freq: Once | INTRAVENOUS | Status: AC
  Administered 2018-04-06: 500 mL via INTRAVENOUS

## 2018-04-06 MED ORDER — ZOLPIDEM TARTRATE 5 MG PO TABS: 5.0000 mg | ORAL_TABLET | Freq: Every evening | ORAL | Status: DC | PRN

## 2018-04-06 MED ORDER — LACTATED RINGERS IV SOLN: 500.0000 mL | Freq: Once | INTRAVENOUS | Status: DC

## 2018-04-06 MED ORDER — SIMETHICONE 80 MG PO CHEW: 80.0000 mg | CHEWABLE_TABLET | ORAL | Status: DC | PRN

## 2018-04-06 MED ORDER — OXYTOCIN 10 UNIT/ML IJ SOLN: 10.0000 [IU] | Freq: Once | INTRAMUSCULAR | Status: DC

## 2018-04-06 MED ORDER — WITCH HAZEL-GLYCERIN EX PADS: 1.0000 "application " | MEDICATED_PAD | CUTANEOUS | Status: DC | PRN

## 2018-04-06 MED ORDER — ONDANSETRON HCL 4 MG/2ML IJ SOLN: 4.0000 mg | Freq: Four times a day (QID) | INTRAMUSCULAR | Status: DC | PRN

## 2018-04-06 MED ORDER — PRENATAL MULTIVITAMIN CH
1.0000 | ORAL_TABLET | Freq: Every day | ORAL | Status: DC
  Administered 2018-04-06 – 2018-04-07 (×2): 1 via ORAL
  Filled 2018-04-06 (×2): qty 1

## 2018-04-06 MED ORDER — PHENYLEPHRINE 40 MCG/ML (10ML) SYRINGE FOR IV PUSH (FOR BLOOD PRESSURE SUPPORT): 80.0000 ug | PREFILLED_SYRINGE | INTRAVENOUS | Status: DC | PRN

## 2018-04-06 MED ORDER — OXYCODONE HCL 5 MG PO TABS: 10.0000 mg | ORAL_TABLET | ORAL | Status: DC | PRN

## 2018-04-06 MED ORDER — FERROUS SULFATE 325 (65 FE) MG PO TABS
325.0000 mg | ORAL_TABLET | Freq: Two times a day (BID) | ORAL | Status: DC
  Administered 2018-04-06 – 2018-04-07 (×4): 325 mg via ORAL
  Filled 2018-04-06 (×5): qty 1

## 2018-04-06 MED ORDER — PHENYLEPHRINE 40 MCG/ML (10ML) SYRINGE FOR IV PUSH (FOR BLOOD PRESSURE SUPPORT)
PREFILLED_SYRINGE | INTRAVENOUS | Status: AC
  Administered 2018-04-06: 80 ug via INTRAVENOUS
  Filled 2018-04-06: qty 10

## 2018-04-06 MED ORDER — PHENYLEPHRINE 40 MCG/ML (10ML) SYRINGE FOR IV PUSH (FOR BLOOD PRESSURE SUPPORT)
80.0000 ug | PREFILLED_SYRINGE | INTRAVENOUS | Status: DC | PRN
  Administered 2018-04-06: 80 ug via INTRAVENOUS
  Filled 2018-04-06: qty 5

## 2018-04-06 MED ORDER — LIDOCAINE HCL (PF) 1 % IJ SOLN
INTRAMUSCULAR | Status: DC | PRN
  Administered 2018-04-06: 5 mL via EPIDURAL

## 2018-04-06 MED ORDER — EPHEDRINE 5 MG/ML INJ: 10.0000 mg | INTRAVENOUS | Status: DC | PRN

## 2018-04-06 MED ORDER — COCONUT OIL OIL: 1.0000 "application " | TOPICAL_OIL | Status: DC | PRN

## 2018-04-06 MED ORDER — ONDANSETRON HCL 4 MG PO TABS: 4.0000 mg | ORAL_TABLET | ORAL | Status: DC | PRN

## 2018-04-06 MED ORDER — OXYCODONE-ACETAMINOPHEN 5-325 MG PO TABS: 1.0000 | ORAL_TABLET | ORAL | Status: DC | PRN

## 2018-04-06 MED ORDER — OXYCODONE HCL 5 MG PO TABS: 5.0000 mg | ORAL_TABLET | ORAL | Status: DC | PRN

## 2018-04-06 MED ORDER — SENNOSIDES-DOCUSATE SODIUM 8.6-50 MG PO TABS
2.0000 | ORAL_TABLET | ORAL | Status: DC
  Administered 2018-04-07 (×2): 2 via ORAL
  Filled 2018-04-06 (×2): qty 2

## 2018-04-06 MED ORDER — OXYCODONE-ACETAMINOPHEN 5-325 MG PO TABS: 2.0000 | ORAL_TABLET | ORAL | Status: DC | PRN

## 2018-04-06 MED ORDER — DIPHENHYDRAMINE HCL 25 MG PO CAPS: 25.0000 mg | ORAL_CAPSULE | Freq: Four times a day (QID) | ORAL | Status: DC | PRN

## 2018-04-06 MED ORDER — ACETAMINOPHEN 325 MG PO TABS: 650.0000 mg | ORAL_TABLET | ORAL | Status: DC | PRN

## 2018-04-06 MED ORDER — IBUPROFEN 600 MG PO TABS
600.0000 mg | ORAL_TABLET | Freq: Four times a day (QID) | ORAL | Status: DC
Start: 2018-04-06 — End: 2018-04-08
  Filled 2018-04-06 (×2): qty 1

## 2018-04-06 MED ORDER — BENZOCAINE-MENTHOL 20-0.5 % EX AERO
1.0000 "application " | INHALATION_SPRAY | CUTANEOUS | Status: DC | PRN
  Administered 2018-04-06: 1 via TOPICAL
  Filled 2018-04-06: qty 56

## 2018-04-06 MED ORDER — ACETAMINOPHEN 325 MG PO TABS
650.0000 mg | ORAL_TABLET | ORAL | Status: DC | PRN
  Administered 2018-04-06 – 2018-04-08 (×7): 650 mg via ORAL
  Filled 2018-04-06 (×7): qty 2

## 2018-04-06 MED ORDER — DIPHENHYDRAMINE HCL 50 MG/ML IJ SOLN: 12.5000 mg | INTRAMUSCULAR | Status: DC | PRN

## 2018-04-06 MED ORDER — SOD CITRATE-CITRIC ACID 500-334 MG/5ML PO SOLN: 30.0000 mL | ORAL | Status: DC | PRN

## 2018-04-06 MED ORDER — DIBUCAINE 1 % RE OINT: 1.0000 "application " | TOPICAL_OINTMENT | RECTAL | Status: DC | PRN

## 2018-04-06 MED ORDER — PHENYLEPHRINE 40 MCG/ML (10ML) SYRINGE FOR IV PUSH (FOR BLOOD PRESSURE SUPPORT)
80.0000 ug | PREFILLED_SYRINGE | INTRAVENOUS | Status: DC | PRN
  Filled 2018-04-06: qty 5

## 2018-04-06 MED ORDER — ONDANSETRON HCL 4 MG/2ML IJ SOLN: 4.0000 mg | INTRAMUSCULAR | Status: DC | PRN

## 2018-04-06 NOTE — Anesthesia Preprocedure Evaluation (Signed)
Anesthesia Evaluation  Patient identified by MRN, date of birth, ID band Patient awake    Reviewed: Allergy & Precautions, NPO status , Patient's Chart, lab work & pertinent test results  Airway Mallampati: II  TM Distance: >3 FB Neck ROM: Full    Dental no notable dental hx.    Pulmonary neg pulmonary ROS,    Pulmonary exam normal breath sounds clear to auscultation       Cardiovascular negative cardio ROS Normal cardiovascular exam Rhythm:Regular Rate:Normal     Neuro/Psych negative neurological ROS     GI/Hepatic negative GI ROS,   Endo/Other    Renal/GU      Musculoskeletal   Abdominal   Peds  Hematology   Anesthesia Other Findings   Reproductive/Obstetrics (+) Pregnancy                             Lab Results  Component Value Date   WBC 9.8 04/06/2018   HGB 11.0 (L) 04/06/2018   HCT 32.9 (L) 04/06/2018   MCV 87.0 04/06/2018   PLT 261 04/06/2018    Anesthesia Physical Anesthesia Plan  ASA: II  Anesthesia Plan: Epidural   Post-op Pain Management:    Induction:   PONV Risk Score and Plan:   Airway Management Planned:   Additional Equipment:   Intra-op Plan:   Post-operative Plan:   Informed Consent: I have reviewed the patients History and Physical, chart, labs and discussed the procedure including the risks, benefits and alternatives for the proposed anesthesia with the patient or authorized representative who has indicated his/her understanding and acceptance.     Plan Discussed with:   Anesthesia Plan Comments:         Anesthesia Quick Evaluation

## 2018-04-06 NOTE — Anesthesia Procedure Notes (Signed)
Epidural Patient location during procedure: OB Start time: 04/06/2018 12:58 AM End time: 04/06/2018 1:10 AM  Staffing Anesthesiologist: Trevor IhaHouser, Stephen A, MD Performed: anesthesiologist   Preanesthetic Checklist Completed: patient identified, site marked, surgical consent, pre-op evaluation, timeout performed, IV checked, risks and benefits discussed and monitors and equipment checked  Epidural Patient position: sitting Prep: site prepped and draped and DuraPrep Patient monitoring: continuous pulse ox and blood pressure Approach: midline Location: L3-L4 Injection technique: LOR air  Needle:  Needle type: Tuohy  Needle gauge: 17 G Needle length: 9 cm and 9 Needle insertion depth: 6 cm Catheter type: closed end flexible Catheter size: 19 Gauge Catheter at skin depth: 12 cm Test dose: negative  Assessment Events: blood not aspirated, injection not painful, no injection resistance, negative IV test and no paresthesia

## 2018-04-06 NOTE — Lactation Note (Signed)
This note was copied from a baby's chart. Lactation Consultation Note  Patient Name: Shawna Lopez VWUJW'JToday's Date: 04/06/2018 Reason for consult: Initial assessment   P2, Ex Bf for 22 mos. Baby 11 hours old. Mother states baby recently breastfed for 10 min and fell asleep. Encouraged mother to unwrap baby, place baby STS  and spoon feed expressed breastmilk. Mom encouraged to feed baby 8-12 times/24 hours and with feeding cues.  Discussed basics but mother is very experienced. Mom made aware of O/P services, breastfeeding support groups, community resources, and our phone # for post-discharge questions.      Maternal Data Has patient been taught Hand Expression?: Yes(per mom) Does the patient have breastfeeding experience prior to this delivery?: Yes  Feeding Feeding Type: Breast Fed Length of feed: 10 min  LATCH Score Latch: Repeated attempts needed to sustain latch, nipple held in mouth throughout feeding, stimulation needed to elicit sucking reflex.  Audible Swallowing: A few with stimulation  Type of Nipple: Everted at rest and after stimulation  Comfort (Breast/Nipple): Soft / non-tender  Hold (Positioning): Assistance needed to correctly position infant at breast and maintain latch.  LATCH Score: 7  Interventions Interventions: Breast feeding basics reviewed  Lactation Tools Discussed/Used     Consult Status Consult Status: Follow-up Date: 04/07/18 Follow-up type: In-patient    Shawna Lopez, Ruth [facility] 04/06/2018, 2:40 PM

## 2018-04-06 NOTE — Anesthesia Postprocedure Evaluation (Signed)
Anesthesia Post Note  Patient: Shawna GladeArzu Blass  Procedure(s) Performed: AN AD HOC LABOR EPIDURAL     Patient location during evaluation: Mother Baby Anesthesia Type: Epidural Level of consciousness: awake and alert, oriented and patient cooperative Pain management: pain level controlled Vital Signs Assessment: post-procedure vital signs reviewed and stable Respiratory status: spontaneous breathing Cardiovascular status: stable Postop Assessment: no headache, epidural receding, patient able to bend at knees and no signs of nausea or vomiting Anesthetic complications: no Comments: Pain score 1.    Last Vitals:  Vitals:   04/06/18 0826 04/06/18 1350  BP: 110/70 115/72  Pulse: 90 92  Resp:    Temp: 36.8 C 36.6 C  SpO2:      Last Pain:  Vitals:   04/06/18 1350  TempSrc: Oral  PainSc: 3    Pain Goal: Patients Stated Pain Goal: 0 (04/05/18 2314)               Merrilyn PumaWRINKLE,Clavin Ruhlman

## 2018-04-07 DIAGNOSIS — D62 Acute posthemorrhagic anemia: Secondary | ICD-10-CM

## 2018-04-07 LAB — CBC
HCT: 28 % — ABNORMAL LOW (ref 36.0–46.0)
Hemoglobin: 9.3 g/dL — ABNORMAL LOW (ref 12.0–15.0)
MCH: 29.2 pg (ref 26.0–34.0)
MCHC: 33.2 g/dL (ref 30.0–36.0)
MCV: 87.8 fL (ref 78.0–100.0)
Platelets: 221 10*3/uL (ref 150–400)
RBC: 3.19 MIL/uL — ABNORMAL LOW (ref 3.87–5.11)
RDW: 15 % (ref 11.5–15.5)
WBC: 11.3 10*3/uL — ABNORMAL HIGH (ref 4.0–10.5)

## 2018-04-07 MED ORDER — MAGNESIUM OXIDE 400 (241.3 MG) MG PO TABS
400.0000 mg | ORAL_TABLET | Freq: Every day | ORAL | Status: DC
  Administered 2018-04-08: 400 mg via ORAL
  Filled 2018-04-07 (×3): qty 1

## 2018-04-07 MED ORDER — POLYSACCHARIDE IRON COMPLEX 150 MG PO CAPS
150.0000 mg | ORAL_CAPSULE | Freq: Every day | ORAL | Status: DC
  Administered 2018-04-08: 150 mg via ORAL
  Filled 2018-04-07 (×2): qty 1

## 2018-04-07 NOTE — Progress Notes (Signed)
CSW received consult for MOB due to her EPDS score. CSW inquired with RN regarding MOB's score and it is a six. CSW will screen out consult due to score being below ten and MOB not answering yes to question 10.  Please reconsult if MOB requests or if additional concerns arise.  Edwin Dadaarol Kolby Myung, MSW, LCSW-A Clinical Social Worker [facility] [phone]

## 2018-04-07 NOTE — Lactation Note (Signed)
This note was copied from a baby's chart. Lactation Consultation Note  Patient Name: Shawna Lopez AVWUJ'WToday's Date: 04/07/2018 Reason for consult: Follow-up assessment   P2, Baby 34 hours old and sleeping in mother's arms.   Baby was circumcised today and since has been having short feedings. Suggest next time her cues to unwrap him and check his diaper to wake him. Mom encouraged to feed baby 8-12 times/24 hours and with feeding cues.  Discussed cluster feeding and call if assistance is needed.    Maternal Data    Feeding Feeding Type: Breast Fed Length of feed: 5 min  LATCH Score                   Interventions    Lactation Tools Discussed/Used     Consult Status Consult Status: Follow-up Date: 04/08/18 Follow-up type: In-patient    Dahlia ByesBerkelhammer, Ruth [facility] 04/07/2018, 1:26 PM

## 2018-04-07 NOTE — Progress Notes (Signed)
PPD #1 SVD w/ 2nd degree laceration Boy "Imran"  S:  Reports feeling well             Tolerating po/ No nausea or vomiting             Bleeding is moderate             Pain controlled with PO meds             Up ad lib / ambulatory / voiding w/o difficulty  Newborn Breast / Circumcision Completed 04/07/18 by Dr. Orbie Pyoousins  O:               VS: BP 96/64 (BP Location: Left Arm)   Pulse 89   Temp 98.7 F (37.1 C) (Oral)   Resp 18    LABS:              Recent Labs    04/06/18 0020 04/07/18 0537  WBC 9.8 11.3*  HGB 11.0* 9.3*  PLT 261 221               Blood type: --/--/A POS (06/29 0020)  Rubella: Immune (12/05 0000)                                  Physical Exam:             Alert and oriented X3  Abdomen: soft, non-tender, non-distended              Fundus: firm, non-tender, U-2  Perineum: well-approximated and healing  Lochia: appropriate, no clots  Extremities: no edema, no calf pain, tenderness, or cords    A:  32 y.o. G2 P2002  PPD # 1  S/P SVD w/ 2nd degree perineal laceration -normal PP exam -stable status Anemia-acute blood loss   P:  Routine post partum orders Initiated Niferex 150 mg PO and Mag oxide 400 mg PO daily Anticipate D/C in AM  Rhea PinkVivian Jodi Criscuolo, [facility] 04/07/2018 5:19 PM

## 2018-04-08 MED ORDER — IBUPROFEN 600 MG PO TABS: 600.0000 mg | ORAL_TABLET | Freq: Four times a day (QID) | ORAL | 0 refills | Status: AC

## 2018-04-08 NOTE — Discharge Summary (Signed)
OB Discharge Summary  Patient Name: Shawna Lopez DOB: Jul 14, 1986 MRN: [id number]  Date of admission: 04/05/2018 Delivering MD: COUSINS, SHERONETTE   Date of discharge: 04/08/2018  Admitting diagnosis: 40wks ctx 4-555mins discharge Intrauterine pregnancy: 6088w1d     Secondary diagnosis:Active Problems:   NSVD 6/29   Postpartum care following vaginal delivery   Second-degree perineal laceration, with delivery   Anemia due to acute blood loss  Additional problems:none     Discharge diagnosis: Term Pregnancy Delivered                                                                     Post partum procedures:none  Augmentation: none  Complications: None  Hospital course:  Onset of Labor With Vaginal Delivery     32 y.o. yo [id number] at 7788w1d was admitted in Active Labor on 04/05/2018. Patient had an uncomplicated labor course as follows:  Membrane Rupture Time/Date: 1:24 AM ,04/06/2018   Intrapartum Procedures: Episiotomy: None [1]                                         Lacerations:  2nd degree [3];Perineal [11]  Patient had a delivery of a Viable infant. 04/06/2018  Information for the patient's newborn:  Malena EdmanZeynalova, Boy Karenann [[id number]][[id number]]  Delivery Method: Vag-Spont    Pateint had an uncomplicated postpartum course.  She is ambulating, tolerating a regular diet, passing flatus, and urinating well. Patient is discharged home in stable condition on 04/08/18.   Physical exam  Vitals:   04/07/18 0506 04/07/18 1433 04/07/18 2232 04/08/18 0525  BP: 112/70 96/64 106/66 99/66  Pulse: 80 89 96 93  Resp: 18 18 18 18   Temp: 97.9 F (36.6 C) 98.7 F (37.1 C) 97.9 F (36.6 C) 97.8 F (36.6 C)  TempSrc: Oral Oral Oral Oral  SpO2:  100% 98% 98%  Weight:      Height:       General: alert and cooperative Lochia: appropriate Uterine Fundus: firm Incision: N/A DVT Evaluation: No evidence of DVT seen on physical exam. Labs: Lab Results  Component Value Date   WBC 11.3 (H)  04/07/2018   HGB 9.3 (L) 04/07/2018   HCT 28.0 (L) 04/07/2018   MCV 87.8 04/07/2018   PLT 221 04/07/2018   No flowsheet data found.  Discharge instruction: per After Visit Summary and "Baby and Me Booklet".  After Visit Meds:  Allergies as of 04/08/2018      Reactions   Ampicillin Nausea And Vomiting, Other (See Comments)   Causes fainting Has patient had a PCN reaction causing immediate rash, facial/tongue/throat swelling, SOB or lightheadedness with hypotension: No Has patient had a PCN reaction causing severe rash involving mucus membranes or skin necrosis: No Has patient had a PCN reaction that required hospitalization: No Has patient had a PCN reaction occurring within the last 10 years: Yes If all of the above answers are "NO", then may proceed with Cephalosporin use.   Penicillins Nausea And Vomiting, Other (See Comments)   Causes fainting Has patient had a PCN reaction causing immediate rash, facial/tongue/throat swelling, SOB or lightheadedness with hypotension: No Has patient had a  PCN reaction causing severe rash involving mucus membranes or skin necrosis: No Has patient had a PCN reaction that required hospitalization: No Has patient had a PCN reaction occurring within the last 10 years: Yes If all of the above answers are "NO", then may proceed with Cephalosporin use.      Medication List    TAKE these medications   albuterol 108 (90 Base) MCG/ACT inhaler Commonly known as:  PROVENTIL HFA;VENTOLIN HFA Inhale 2 puffs into the lungs every 6 (six) hours as needed for wheezing or shortness of breath.   ibuprofen 600 MG tablet Commonly known as:  ADVIL,MOTRIN Take 1 tablet (600 mg total) by mouth every 6 (six) hours.   prenatal multivitamin Tabs tablet Take 1 tablet by mouth daily at 12 noon.       Diet: routine diet  Activity: Advance as tolerated. Pelvic rest for 6 weeks.   Outpatient follow up:6 weeks Follow up Appt:No future appointments. Follow up visit:  No follow-ups on file.  Postpartum contraception: Not Discussed  Newborn Data: Live born female  Birth Weight: 8 lb 11.3 oz (3950 g) APGAR: 9, 9  Newborn Delivery   Birth date/time:  04/06/2018 03:21:00 Delivery type:  Vaginal, Spontaneous     Baby Feeding: Breast Disposition:home with mother   04/08/2018 Lendon Colonel, MD

## 2018-04-08 NOTE — Lactation Note (Signed)
This note was copied from a baby's chart. Lactation Consultation Note  Patient Name: Shawna Wilder Gladerzu Cabiness WUJWJ'XToday's Date: 04/08/2018 Reason for consult: Follow-up assessment   P2, Ex BF.  Baby 53 hours old and latched upon entering.  Stools yellow. Mother states he keeps falling asleep.  Suggest unwrapping/undressing him for feedings. Mom encouraged to feed baby 8-12 times/24 hours and with feeding cues.  Reviewed engorgement care and monitoring voids/stools. Mother denies questions or concerns.     Maternal Data    Feeding Feeding Type: Breast Fed  LATCH Score Latch: Grasps breast easily, tongue down, lips flanged, rhythmical sucking.  Audible Swallowing: A few with stimulation  Type of Nipple: Everted at rest and after stimulation  Comfort (Breast/Nipple): Soft / non-tender  Hold (Positioning): No assistance needed to correctly position infant at breast.  LATCH Score: 9  Interventions    Lactation Tools Discussed/Used     Consult Status Consult Status: Complete Date: 04/08/18    Dahlia ByesBerkelhammer, Warrene Kapfer [facility] 04/08/2018, 8:56 AM
